# Patient Record
Sex: Female | Born: 1965 | Race: White | Hispanic: No | Marital: Married | Smoking: Never smoker
Health system: Southern US, Community
[De-identification: ages and names within clinical notes are randomized; demographics above are authoritative.]

## PROBLEM LIST (undated history)

## (undated) HISTORY — PX: BREAST EXCISIONAL BIOPSY: SUR124

---

## 2015-06-17 ENCOUNTER — Other Ambulatory Visit: Payer: Self-pay | Admitting: Physician Assistant

## 2015-06-17 DIAGNOSIS — Z1231 Encounter for screening mammogram for malignant neoplasm of breast: Secondary | ICD-10-CM

## 2015-07-04 ENCOUNTER — Ambulatory Visit
Admission: RE | Admit: 2015-07-04 | Discharge: 2015-07-04 | Disposition: A | Discharge status: Home / Self Care | Payer: BLUE CROSS/BLUE SHIELD | Source: Ambulatory Visit | Attending: Physician Assistant | Admitting: Physician Assistant

## 2015-07-04 DIAGNOSIS — Z1231 Encounter for screening mammogram for malignant neoplasm of breast: Secondary | ICD-10-CM

## 2017-04-14 ENCOUNTER — Other Ambulatory Visit: Payer: Self-pay | Admitting: Physician Assistant

## 2017-04-14 DIAGNOSIS — Z1231 Encounter for screening mammogram for malignant neoplasm of breast: Secondary | ICD-10-CM

## 2017-05-02 ENCOUNTER — Ambulatory Visit
Admission: RE | Admit: 2017-05-02 | Discharge: 2017-05-02 | Disposition: A | Discharge status: Home / Self Care | Payer: BLUE CROSS/BLUE SHIELD | Source: Ambulatory Visit | Attending: Physician Assistant | Admitting: Physician Assistant

## 2017-05-02 ENCOUNTER — Encounter: Payer: Self-pay | Admitting: Radiology

## 2017-05-02 DIAGNOSIS — Z1231 Encounter for screening mammogram for malignant neoplasm of breast: Secondary | ICD-10-CM

## 2018-10-04 ENCOUNTER — Other Ambulatory Visit: Payer: Self-pay | Admitting: Physician Assistant

## 2018-10-04 DIAGNOSIS — Z1231 Encounter for screening mammogram for malignant neoplasm of breast: Secondary | ICD-10-CM

## 2018-11-16 ENCOUNTER — Ambulatory Visit
Admission: RE | Admit: 2018-11-16 | Discharge: 2018-11-16 | Disposition: A | Discharge status: Home / Self Care | Payer: BC Managed Care – PPO | Source: Ambulatory Visit | Attending: Physician Assistant | Admitting: Physician Assistant

## 2018-11-16 ENCOUNTER — Other Ambulatory Visit: Payer: Self-pay

## 2018-11-16 DIAGNOSIS — Z1231 Encounter for screening mammogram for malignant neoplasm of breast: Secondary | ICD-10-CM

## 2019-09-19 ENCOUNTER — Other Ambulatory Visit: Payer: Self-pay | Admitting: Orthopedic Surgery

## 2019-09-19 DIAGNOSIS — M533 Sacrococcygeal disorders, not elsewhere classified: Secondary | ICD-10-CM

## 2019-09-19 DIAGNOSIS — M545 Low back pain, unspecified: Secondary | ICD-10-CM

## 2019-09-19 DIAGNOSIS — G8929 Other chronic pain: Secondary | ICD-10-CM

## 2019-12-21 ENCOUNTER — Other Ambulatory Visit: Payer: Self-pay | Admitting: Physician Assistant

## 2019-12-21 DIAGNOSIS — Z1231 Encounter for screening mammogram for malignant neoplasm of breast: Secondary | ICD-10-CM

## 2020-01-31 ENCOUNTER — Other Ambulatory Visit: Payer: Self-pay

## 2020-01-31 ENCOUNTER — Ambulatory Visit
Admission: RE | Admit: 2020-01-31 | Discharge: 2020-01-31 | Disposition: A | Discharge status: Home / Self Care | Payer: BC Managed Care – PPO | Source: Ambulatory Visit | Attending: Physician Assistant | Admitting: Physician Assistant

## 2020-01-31 DIAGNOSIS — Z1231 Encounter for screening mammogram for malignant neoplasm of breast: Secondary | ICD-10-CM

## 2020-03-12 ENCOUNTER — Ambulatory Visit
Admission: RE | Admit: 2020-03-12 | Discharge: 2020-03-12 | Disposition: A | Discharge status: Home / Self Care | Payer: BC Managed Care – PPO | Source: Ambulatory Visit | Attending: Physician Assistant | Admitting: Physician Assistant

## 2020-03-12 ENCOUNTER — Other Ambulatory Visit: Payer: Self-pay

## 2020-03-14 ENCOUNTER — Other Ambulatory Visit: Payer: Self-pay | Admitting: Physician Assistant

## 2020-03-14 DIAGNOSIS — N6489 Other specified disorders of breast: Secondary | ICD-10-CM

## 2020-03-22 ENCOUNTER — Ambulatory Visit
Admission: RE | Admit: 2020-03-22 | Discharge: 2020-03-22 | Disposition: A | Discharge status: Home / Self Care | Payer: BC Managed Care – PPO | Source: Ambulatory Visit | Attending: Physician Assistant | Admitting: Physician Assistant

## 2020-03-22 ENCOUNTER — Other Ambulatory Visit: Payer: Self-pay

## 2020-03-22 DIAGNOSIS — N6489 Other specified disorders of breast: Secondary | ICD-10-CM

## 2020-03-31 ENCOUNTER — Other Ambulatory Visit: Payer: BC Managed Care – PPO

## 2021-07-30 ENCOUNTER — Other Ambulatory Visit: Payer: Self-pay | Admitting: Physician Assistant

## 2021-07-30 DIAGNOSIS — Z1231 Encounter for screening mammogram for malignant neoplasm of breast: Secondary | ICD-10-CM

## 2021-07-31 ENCOUNTER — Ambulatory Visit
Admission: RE | Admit: 2021-07-31 | Discharge: 2021-07-31 | Disposition: A | Discharge status: Home / Self Care | Payer: BC Managed Care – PPO | Source: Ambulatory Visit | Attending: Physician Assistant | Admitting: Physician Assistant

## 2021-07-31 DIAGNOSIS — Z1231 Encounter for screening mammogram for malignant neoplasm of breast: Secondary | ICD-10-CM

## 2022-06-22 ENCOUNTER — Ambulatory Visit
Admission: RE | Admit: 2022-06-22 | Discharge: 2022-06-22 | Disposition: A | Discharge status: Home / Self Care | Payer: BC Managed Care – PPO | Source: Ambulatory Visit | Attending: Nurse Practitioner | Admitting: Nurse Practitioner

## 2022-06-22 ENCOUNTER — Ambulatory Visit (INDEPENDENT_AMBULATORY_CARE_PROVIDER_SITE_OTHER): Payer: BC Managed Care – PPO

## 2022-06-22 VITALS — BP 133/84 | HR 102 | Temp 99.9°F | Resp 20

## 2022-06-22 DIAGNOSIS — J189 Pneumonia, unspecified organism: Secondary | ICD-10-CM

## 2022-06-22 DIAGNOSIS — Z1152 Encounter for screening for COVID-19: Secondary | ICD-10-CM

## 2022-06-22 MED ORDER — ALBUTEROL SULFATE (2.5 MG/3ML) 0.083% IN NEBU
2.5000 mg | INHALATION_SOLUTION | Freq: Once | RESPIRATORY_TRACT | Status: AC
  Administered 2022-06-22: 2.5 mg via RESPIRATORY_TRACT

## 2022-06-22 MED ORDER — PREDNISONE 20 MG PO TABS: 40.0000 mg | ORAL_TABLET | Freq: Every day | ORAL | 0 refills | Status: AC

## 2022-06-22 MED ORDER — PROMETHAZINE-DM 6.25-15 MG/5ML PO SYRP: 5.0000 mL | ORAL_SOLUTION | Freq: Every evening | ORAL | 0 refills | Status: AC | PRN

## 2022-06-22 MED ORDER — AZITHROMYCIN 250 MG PO TABS: ORAL_TABLET | ORAL | 0 refills | Status: AC

## 2022-06-22 MED ORDER — BENZONATATE 100 MG PO CAPS: 100.0000 mg | ORAL_CAPSULE | Freq: Three times a day (TID) | ORAL | 0 refills | Status: AC | PRN

## 2022-06-22 MED ORDER — ALBUTEROL SULFATE HFA 108 (90 BASE) MCG/ACT IN AERS: 1.0000 | INHALATION_SPRAY | Freq: Four times a day (QID) | RESPIRATORY_TRACT | 0 refills | Status: AC | PRN

## 2022-06-22 NOTE — ED Triage Notes (Signed)
Pt c/o having fever off and on x 6 days then developed a dry, hacking cough. Treating symptoms w/ tylenol and ibuprofen for cough otc cough syrup.

## 2022-06-22 NOTE — Discharge Instructions (Addendum)
The chest x-ray today shows atypical pneumonia versus viral pneumonia.  I would like you to be treated for atypical pneumonia since her symptoms have been going on for 6 days-please take the azithromycin as prescribed.    We have given you an albuterol breathing treatment today which helped open up your airways a little bit.  You can continue the albuterol inhaler at home every 4-6 hours as needed for wheezing or shortness of breath.  Start the oral prednisone tomorrow morning.  Start cough suppressant medication including Tessalon Perles during the day as needed for dry cough and cough syrup at nighttime as needed for dry cough.  Recommend follow-up in approximately 6 weeks with PCP for reevaluation of breathing and to have repeat chest x-ray to ensure full resolution.  Seek care sooner if symptoms persist or worsen despite treatment.

## 2022-06-22 NOTE — ED Provider Notes (Signed)
[facility]    CSN: [id number] Arrival date & time: 06/22/22  1149      History   Chief Complaint Chief Complaint  Patient presents with   Fever    I have had a fever off and on for the last six days and have now developed a dry, hacking cough. - Entered by patient    HPI Carmen Baxter is a 57 y.o. female.   Patient presents today for 6-day history of fever, body aches and chills, congested and dry cough, shortness of breath and chest pain that began this morning.  She also endorses headache, decreased appetite, and fatigue.  No chest tightness, runny/stuffy nose, postnasal drainage or sore throat, ear pain, abdominal pain, nausea/vomiting, or loss of taste/smell.  She does report 1 episode of diarrhea this morning.  Has been taking Tylenol and ibuprofen as well as dextromethorphan last night for the cough which seems to have helped with his symptoms mildly.  Patient denies history of chronic lung disease.  She denies smoking or vaping history.    History reviewed. No pertinent past medical history.  There are no problems to display for this patient.   Past Surgical History:  Procedure Laterality Date   BREAST EXCISIONAL BIOPSY Right     OB History   No obstetric history on file.      Home Medications    Prior to Admission medications   Medication Sig Start Date End Date Taking? Authorizing Provider  albuterol (VENTOLIN HFA) 108 (90 Base) MCG/ACT inhaler Inhale 1-2 puffs into the lungs every 6 (six) hours as needed for wheezing or shortness of breath. 06/22/22  Yes Valentino Nose, NP  azithromycin (ZITHROMAX) 250 MG tablet Take (2) tablets by mouth on day 1, then take (1) tablet by mouth on days 2-5. 06/22/22  Yes Valentino Nose, NP  benzonatate (TESSALON) 100 MG capsule Take 1 capsule (100 mg total) by mouth 3 (three) times daily as needed for cough. Do not take with alcohol or while driving or operating heavy machinery.  May cause drowsiness.  06/22/22  Yes Valentino Nose, NP  estradiol (VIVELLE-DOT) 0.05 MG/24HR patch Place 1 patch onto the skin 2 (two) times a week. 06/17/22  Yes [provider]  predniSONE (DELTASONE) 20 MG tablet Take 2 tablets (40 mg total) by mouth daily with breakfast for 5 days. 06/22/22 06/27/22 Yes Valentino Nose, NP  progesterone (PROMETRIUM) 100 MG capsule Take by mouth. 06/21/22  Yes [provider]  promethazine-dextromethorphan (PROMETHAZINE-DM) 6.25-15 MG/5ML syrup Take 5 mLs by mouth at bedtime as needed for cough. 06/22/22  Yes Cathlean Marseilles A, NP  magnesium citrate SOLN Take 1 Bottle by mouth.    [provider]    Family History History reviewed. No pertinent family history.  Social History Social History   Tobacco Use   Smoking status: Never   Smokeless tobacco: Never     Allergies   Patient has no known allergies.   Review of Systems Review of Systems Per HPI  Physical Exam Triage Vital Signs ED Triage Vitals  Enc Vitals Group     BP 06/22/22 1237 (!) 142/82     Pulse Rate 06/22/22 1237 (!) 103     Resp 06/22/22 1237 16     Temp 06/22/22 1237 99.7 F (37.6 C)     Temp Source 06/22/22 1237 Oral     SpO2 06/22/22 1237 91 %     Weight --      Height --  Head Circumference --      Peak Flow --      Pain Score 06/22/22 1238 0     Pain Loc --      Pain Edu? --      Excl. in GC? --    No data found.  Updated Vital Signs BP 133/84 (BP Location: Right Arm)   Pulse (!) 102   Temp 99.9 F (37.7 C) (Oral)   Resp 20   LMP 04/20/2015   SpO2 94%   Visual Acuity Right Eye Distance:   Left Eye Distance:   Bilateral Distance:    Right Eye Near:   Left Eye Near:    Bilateral Near:     Physical Exam Vitals and nursing note reviewed.  Constitutional:      General: She is not in acute distress.    Appearance: Normal appearance. She is not ill-appearing or toxic-appearing.  HENT:     Head: Normocephalic and atraumatic.     Right  Ear: Tympanic membrane, ear canal and external ear normal.     Left Ear: Tympanic membrane, ear canal and external ear normal.     Nose: No congestion or rhinorrhea.     Mouth/Throat:     Mouth: Mucous membranes are moist.     Pharynx: Oropharynx is clear. No oropharyngeal exudate or posterior oropharyngeal erythema.  Eyes:     General: No scleral icterus.    Extraocular Movements: Extraocular movements intact.  Cardiovascular:     Rate and Rhythm: Regular rhythm. Tachycardia present.  Pulmonary:     Effort: Pulmonary effort is normal. No respiratory distress.     Breath sounds: Decreased air movement present. Rhonchi present. No wheezing.  Abdominal:     General: Abdomen is flat. Bowel sounds are normal. There is no distension.     Palpations: Abdomen is soft.  Musculoskeletal:     Cervical back: Normal range of motion and neck supple.  Lymphadenopathy:     Cervical: No cervical adenopathy.  Skin:    General: Skin is warm and dry.     Coloration: Skin is not jaundiced or pale.     Findings: No erythema or rash.  Neurological:     Mental Status: She is alert and oriented to person, place, and time.  Psychiatric:        Behavior: Behavior is cooperative.      UC Treatments / Results  Labs (all labs ordered are listed, but only abnormal results are displayed) Labs Reviewed  SARS CORONAVIRUS 2 (TAT 6-24 HRS)    EKG   Radiology DG Chest 2 View  Result Date: 06/22/2022 CLINICAL DATA:  Cough, fever EXAM: CHEST - 2 VIEW COMPARISON:  None Available. FINDINGS: The heart size and mediastinal contours are within normal limits. Mildly increased perihilar and bibasilar interstitial markings. Small left pleural effusion. No pneumothorax. Dextrocurvature of the lower thoracic spine. IMPRESSION: Mildly increased perihilar and bibasilar interstitial markings, which may reflect edema or atypical/viral infection. Small left pleural effusion. Electronically Signed   By: Duanne Guess  D.O.   On: 06/22/2022 13:39    Procedures Procedures (including critical care time)  Medications Ordered in UC Medications  albuterol (PROVENTIL) (2.5 MG/3ML) 0.083% nebulizer solution 2.5 mg (2.5 mg Nebulization Given 06/22/22 1325)    Initial Impression / Assessment and Plan / UC Course  I have reviewed the triage vital signs and the nursing notes.  Pertinent labs & imaging results that were available during my care of the patient were reviewed  by me and considered in my medical decision making (see chart for details).   Patient is well-appearing and normotensive in triage today.  She also has a low-grade fever, is mildly tachycardic, however is not tachypneic.  Initially in triage, SpO2 was 91% on room air.  After albuterol breathing treatment, oxygenation increased to 94% on room air.    1. Atypical pneumonia 2. Encounter for screening for COVID-19 Chest x-ray today shows increased interstitial markings-atypical/viral infection with small left pleural effusion Will treat with azithromycin to cover for atypical infection, start oral prednisone for lung inflammation Continue albuterol inhalers at home every 4-6 hours as needed for wheezing or shortness of breath Start cough suppressant medication Other supportive care discussed with patient and ER/return precautions also discussed Recommended follow-up with PCP in 6 weeks for reevaluation of lungs by chest x-ray Seek care for persistent symptoms in the meantime  The patient was given the opportunity to ask questions.  All questions answered to their satisfaction.  The patient is in agreement to this plan.    Final Clinical Impressions(s) / UC Diagnoses   Final diagnoses:  Atypical pneumonia  Encounter for screening for COVID-19     Discharge Instructions      The chest x-ray today shows atypical pneumonia versus viral pneumonia.  I would like you to be treated for atypical pneumonia since her symptoms have been going on for 6  days-please take the azithromycin as prescribed.    We have given you an albuterol breathing treatment today which helped open up your airways a little bit.  You can continue the albuterol inhaler at home every 4-6 hours as needed for wheezing or shortness of breath.  Start the oral prednisone tomorrow morning.  Start cough suppressant medication including Tessalon Perles during the day as needed for dry cough and cough syrup at nighttime as needed for dry cough.  Recommend follow-up in approximately 6 weeks with PCP for reevaluation of breathing and to have repeat chest x-ray to ensure full resolution.  Seek care sooner if symptoms persist or worsen despite treatment.     ED Prescriptions     Medication Sig Dispense Auth. Provider   azithromycin (ZITHROMAX) 250 MG tablet Take (2) tablets by mouth on day 1, then take (1) tablet by mouth on days 2-5. 6 tablet Cathlean Marseilles A, NP   predniSONE (DELTASONE) 20 MG tablet Take 2 tablets (40 mg total) by mouth daily with breakfast for 5 days. 10 tablet Cathlean Marseilles A, NP   albuterol (VENTOLIN HFA) 108 (90 Base) MCG/ACT inhaler Inhale 1-2 puffs into the lungs every 6 (six) hours as needed for wheezing or shortness of breath. 18 g Cathlean Marseilles A, NP   benzonatate (TESSALON) 100 MG capsule Take 1 capsule (100 mg total) by mouth 3 (three) times daily as needed for cough. Do not take with alcohol or while driving or operating heavy machinery.  May cause drowsiness. 21 capsule Cathlean Marseilles A, NP   promethazine-dextromethorphan (PROMETHAZINE-DM) 6.25-15 MG/5ML syrup Take 5 mLs by mouth at bedtime as needed for cough. 118 mL Valentino Nose, NP      PDMP not reviewed this encounter.   Valentino Nose, NP 06/22/22 1453

## 2022-06-23 LAB — SARS CORONAVIRUS 2 (TAT 6-24 HRS): SARS Coronavirus 2: NEGATIVE

## 2022-07-14 ENCOUNTER — Other Ambulatory Visit: Payer: Self-pay | Admitting: Nurse Practitioner

## 2022-07-22 IMAGING — MG MM DIGITAL DIAGNOSTIC UNILAT*L* W/ TOMO W/ CAD
4 series · 4 of 12 positions shown · non-contrast
Comparison: Previous exams including recent screening mammogram
dated 04/01/2020.

CLINICAL DATA: Patient returns today to evaluate a possible LEFT
breast asymmetry questioned on recent screening mammogram.

EXAM:
DIGITAL DIAGNOSTIC UNILATERAL LEFT MAMMOGRAM WITH TOMOSYNTHESIS AND
CAD;
ULTRASOUND LEFT BREAST LIMITED
TECHNIQUE: Left digital diagnostic mammography and breast tomosynthesis was
performed. The images were evaluated with computer-aided detection.;
Targeted ultrasound examination of the left breast was performed.

[L ML synth-2D]
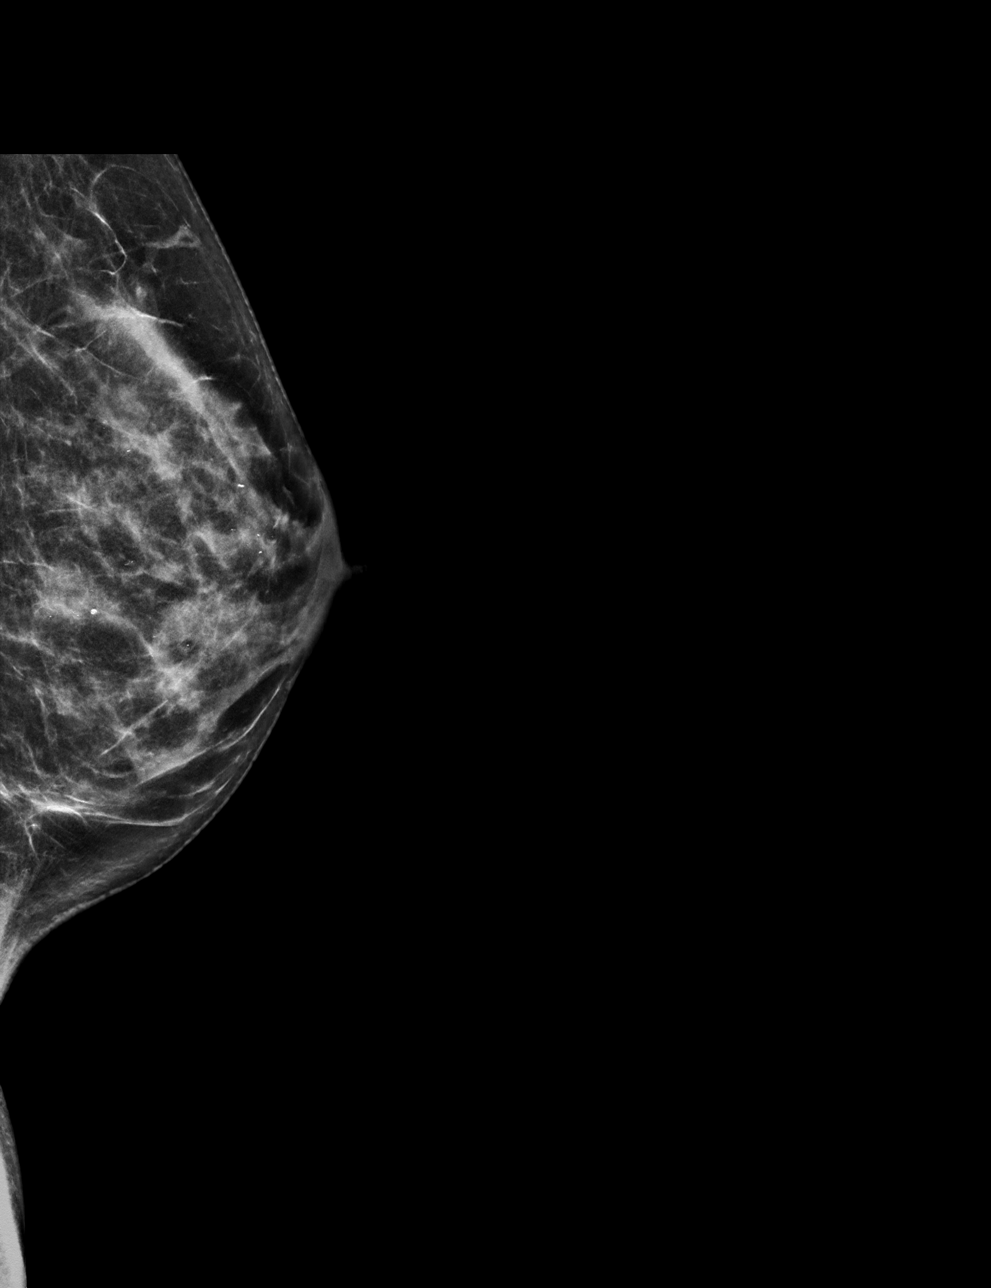

[L CC synth-2D]
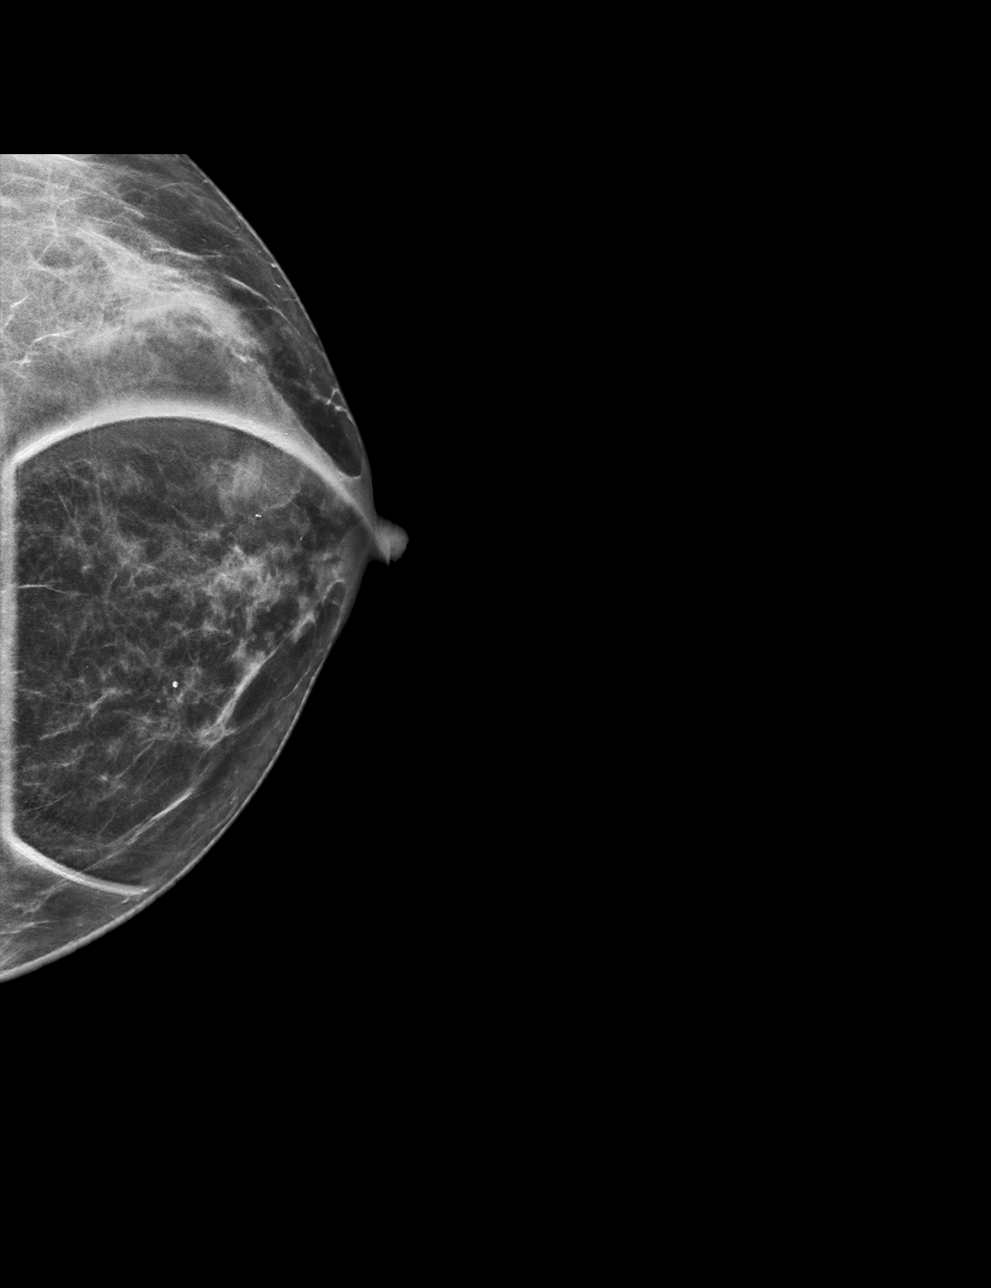

[L ML tomo · tomo slice 30/59.0]
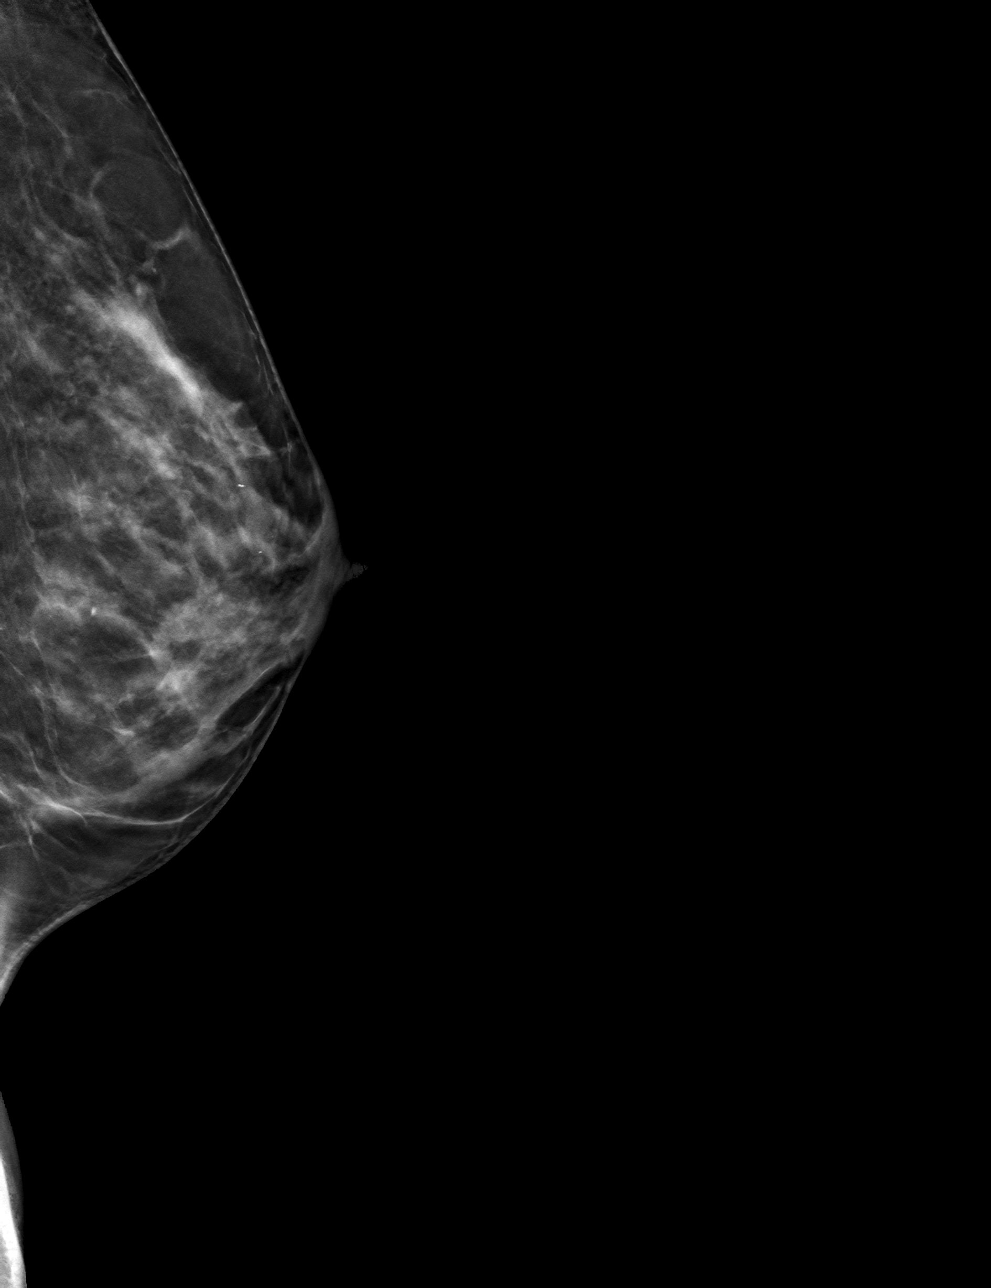

[L CC tomo · tomo slice 27/54.0]
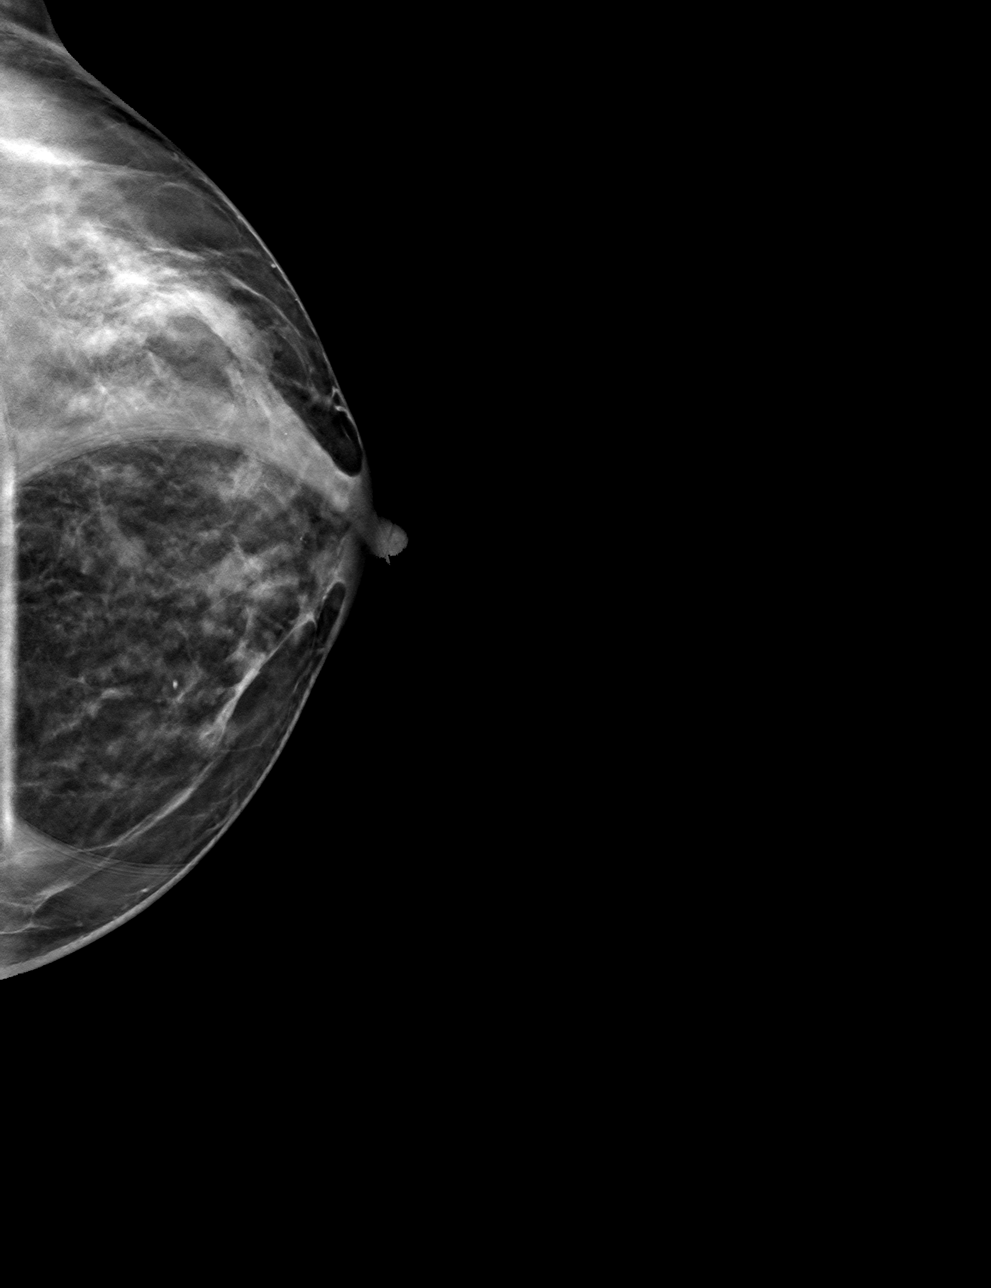

[4 of 12 positions shown; findings below may reference images not displayed]

ACR Breast Density Category c: The breast tissue is heterogeneously
dense, which may obscure small masses.
FINDINGS: On today's additional diagnostic views, including spot compression
with 3D tomosynthesis, a partially obscured mass is confirmed within
the slightly inner to retroareolar LEFT breast, middle to posterior
depth, measuring approximately 6 mm greatest dimension.

Targeted ultrasound is performed, evaluating the inner and
retroareolar LEFT breast, showing a benign cyst at the 11 o'clock
axis, 2 cm from the nipple, measuring 5 mm, corresponding to the
mammographic finding. A smaller adjacent cyst is identified at the
11 o'clock axis, corresponding as an incidental finding. No
suspicious solid or cystic mass is identified by ultrasound.
IMPRESSION: No evidence of malignancy. Benign cyst within the LEFT breast at the
11 o'clock axis, measuring 5 mm, corresponding to the mammographic
finding.

Patient may return to routine annual bilateral screening mammogram
schedule.

RECOMMENDATION:
Screening mammogram in one year.(Code:Z3-B-LHL)

I have discussed the findings and recommendations with the patient.
If applicable, a reminder letter will be sent to the patient
regarding the next appointment.

BI-RADS CATEGORY  2: Benign.

## 2022-09-13 ENCOUNTER — Other Ambulatory Visit: Payer: Self-pay | Admitting: Physician Assistant

## 2022-09-13 DIAGNOSIS — Z13820 Encounter for screening for osteoporosis: Secondary | ICD-10-CM

## 2022-11-09 ENCOUNTER — Other Ambulatory Visit: Payer: Self-pay | Admitting: Physician Assistant

## 2022-11-09 ENCOUNTER — Ambulatory Visit
Admission: RE | Admit: 2022-11-09 | Discharge: 2022-11-09 | Disposition: A | Discharge status: Home / Self Care | Payer: BC Managed Care – PPO | Source: Ambulatory Visit | Attending: Physician Assistant

## 2022-11-09 DIAGNOSIS — Z1231 Encounter for screening mammogram for malignant neoplasm of breast: Secondary | ICD-10-CM

## 2022-11-09 DIAGNOSIS — Z13820 Encounter for screening for osteoporosis: Secondary | ICD-10-CM

## 2022-12-22 ENCOUNTER — Ambulatory Visit: Payer: BC Managed Care – PPO

## 2023-01-13 ENCOUNTER — Ambulatory Visit
Admission: RE | Admit: 2023-01-13 | Discharge: 2023-01-13 | Disposition: A | Discharge status: Home / Self Care | Payer: BC Managed Care – PPO | Source: Ambulatory Visit | Attending: Physician Assistant | Admitting: Physician Assistant

## 2023-01-13 DIAGNOSIS — Z1231 Encounter for screening mammogram for malignant neoplasm of breast: Secondary | ICD-10-CM
